# Patient Record
Sex: Male | Born: 1965 | Race: Black or African American | Hispanic: No | Marital: Married | Smoking: Current every day smoker
Health system: Southern US, Community
[De-identification: ages and names within clinical notes are randomized; demographics above are authoritative.]

## PROBLEM LIST (undated history)

## (undated) DIAGNOSIS — F17200 Nicotine dependence, unspecified, uncomplicated: Secondary | ICD-10-CM

## (undated) DIAGNOSIS — I10 Essential (primary) hypertension: Secondary | ICD-10-CM

---

## 2001-01-09 ENCOUNTER — Emergency Department (HOSPITAL_COMMUNITY): Admission: EM | Admit: 2001-01-09 | Discharge: 2001-01-09 | Payer: Self-pay | Admitting: *Deleted

## 2008-09-11 ENCOUNTER — Emergency Department (HOSPITAL_COMMUNITY): Admission: EM | Admit: 2008-09-11 | Discharge: 2008-09-12 | Payer: Self-pay | Admitting: Emergency Medicine

## 2011-07-16 LAB — POCT I-STAT, CHEM 8
BUN: 8 mg/dL (ref 6–23)
Calcium, Ion: 1.17 mmol/L (ref 1.12–1.32)
Chloride: 105 mEq/L (ref 96–112)
Creatinine, Ser: 0.9 mg/dL (ref 0.4–1.5)
Glucose, Bld: 90 mg/dL (ref 70–99)
HCT: 46 % (ref 39.0–52.0)
Hemoglobin: 15.6 g/dL (ref 13.0–17.0)
Potassium: 3.7 mEq/L (ref 3.5–5.1)
Sodium: 142 mEq/L (ref 135–145)
TCO2: 26 mmol/L (ref 0–100)

## 2016-11-05 ENCOUNTER — Emergency Department (HOSPITAL_COMMUNITY): Payer: Self-pay

## 2016-11-05 ENCOUNTER — Inpatient Hospital Stay (HOSPITAL_COMMUNITY)
Admission: EM | Admit: 2016-11-05 | Discharge: 2016-11-06 | DRG: 062 | Disposition: A | Discharge status: Home / Self Care | Payer: Self-pay | Attending: Neurology | Admitting: Neurology

## 2016-11-05 ENCOUNTER — Inpatient Hospital Stay (HOSPITAL_COMMUNITY): Payer: Self-pay

## 2016-11-05 ENCOUNTER — Encounter (HOSPITAL_COMMUNITY): Payer: Self-pay | Admitting: Emergency Medicine

## 2016-11-05 DIAGNOSIS — I6300 Cerebral infarction due to thrombosis of unspecified precerebral artery: Secondary | ICD-10-CM

## 2016-11-05 DIAGNOSIS — I161 Hypertensive emergency: Secondary | ICD-10-CM | POA: Diagnosis present

## 2016-11-05 DIAGNOSIS — I639 Cerebral infarction, unspecified: Principal | ICD-10-CM | POA: Diagnosis present

## 2016-11-05 DIAGNOSIS — T465X6A Underdosing of other antihypertensive drugs, initial encounter: Secondary | ICD-10-CM | POA: Diagnosis present

## 2016-11-05 DIAGNOSIS — E041 Nontoxic single thyroid nodule: Secondary | ICD-10-CM | POA: Diagnosis present

## 2016-11-05 DIAGNOSIS — Y92009 Unspecified place in unspecified non-institutional (private) residence as the place of occurrence of the external cause: Secondary | ICD-10-CM

## 2016-11-05 DIAGNOSIS — G8194 Hemiplegia, unspecified affecting left nondominant side: Secondary | ICD-10-CM

## 2016-11-05 DIAGNOSIS — I69354 Hemiplegia and hemiparesis following cerebral infarction affecting left non-dominant side: Secondary | ICD-10-CM

## 2016-11-05 DIAGNOSIS — F17200 Nicotine dependence, unspecified, uncomplicated: Secondary | ICD-10-CM | POA: Diagnosis present

## 2016-11-05 DIAGNOSIS — E785 Hyperlipidemia, unspecified: Secondary | ICD-10-CM | POA: Diagnosis present

## 2016-11-05 DIAGNOSIS — E876 Hypokalemia: Secondary | ICD-10-CM | POA: Diagnosis present

## 2016-11-05 DIAGNOSIS — I10 Essential (primary) hypertension: Secondary | ICD-10-CM | POA: Diagnosis present

## 2016-11-05 DIAGNOSIS — I6789 Other cerebrovascular disease: Secondary | ICD-10-CM

## 2016-11-05 DIAGNOSIS — Z91120 Patient's intentional underdosing of medication regimen due to financial hardship: Secondary | ICD-10-CM

## 2016-11-05 DIAGNOSIS — I160 Hypertensive urgency: Secondary | ICD-10-CM

## 2016-11-05 DIAGNOSIS — I6330 Cerebral infarction due to thrombosis of unspecified cerebral artery: Secondary | ICD-10-CM

## 2016-11-05 DIAGNOSIS — R29707 NIHSS score 7: Secondary | ICD-10-CM | POA: Diagnosis present

## 2016-11-05 HISTORY — DX: Nicotine dependence, unspecified, uncomplicated: F17.200

## 2016-11-05 HISTORY — DX: Essential (primary) hypertension: I10

## 2016-11-05 LAB — PROTIME-INR
INR: 0.97
PROTHROMBIN TIME: 12.9 s (ref 11.4–15.2)

## 2016-11-05 LAB — CBC
HCT: 45.2 % (ref 39.0–52.0)
Hemoglobin: 15.2 g/dL (ref 13.0–17.0)
MCH: 25.8 pg — AB (ref 26.0–34.0)
MCHC: 33.6 g/dL (ref 30.0–36.0)
MCV: 76.6 fL — ABNORMAL LOW (ref 78.0–100.0)
PLATELETS: 218 10*3/uL (ref 150–400)
RBC: 5.9 MIL/uL — AB (ref 4.22–5.81)
RDW: 16.2 % — ABNORMAL HIGH (ref 11.5–15.5)
WBC: 7.8 10*3/uL (ref 4.0–10.5)

## 2016-11-05 LAB — COMPREHENSIVE METABOLIC PANEL
ALBUMIN: 4.1 g/dL (ref 3.5–5.0)
ALK PHOS: 87 U/L (ref 38–126)
ALT: 15 U/L — ABNORMAL LOW (ref 17–63)
ANION GAP: 10 (ref 5–15)
AST: 19 U/L (ref 15–41)
BUN: 10 mg/dL (ref 6–20)
CALCIUM: 9.4 mg/dL (ref 8.9–10.3)
CHLORIDE: 107 mmol/L (ref 101–111)
CO2: 24 mmol/L (ref 22–32)
Creatinine, Ser: 0.85 mg/dL (ref 0.61–1.24)
GFR calc Af Amer: >60 mL/min (ref >60)
GFR calc non Af Amer: >60 mL/min (ref >60)
GLUCOSE: 91 mg/dL (ref 65–99)
Potassium: 3.3 mmol/L — ABNORMAL LOW (ref 3.5–5.1)
SODIUM: 141 mmol/L (ref 135–145)
Total Bilirubin: 0.4 mg/dL (ref 0.3–1.2)
Total Protein: 8.2 g/dL — ABNORMAL HIGH (ref 6.5–8.1)

## 2016-11-05 LAB — I-STAT CHEM 8, ED
BUN: 11 mg/dL (ref 6–20)
Calcium, Ion: 1.11 mmol/L — ABNORMAL LOW (ref 1.15–1.40)
Chloride: 105 mmol/L (ref 101–111)
Creatinine, Ser: 0.9 mg/dL (ref 0.61–1.24)
Glucose, Bld: 88 mg/dL (ref 65–99)
HEMATOCRIT: 50 % (ref 39.0–52.0)
HEMOGLOBIN: 17 g/dL (ref 13.0–17.0)
Potassium: 3.3 mmol/L — ABNORMAL LOW (ref 3.5–5.1)
SODIUM: 144 mmol/L (ref 135–145)
TCO2: 27 mmol/L (ref 0–100)

## 2016-11-05 LAB — RAPID URINE DRUG SCREEN, HOSP PERFORMED
Amphetamines: NOT DETECTED
BENZODIAZEPINES: NOT DETECTED
Barbiturates: NOT DETECTED
COCAINE: NOT DETECTED
OPIATES: NOT DETECTED
Tetrahydrocannabinol: NOT DETECTED

## 2016-11-05 LAB — DIFFERENTIAL
Basophils Absolute: 0 10*3/uL (ref 0.0–0.1)
Basophils Relative: 1 %
EOS PCT: 4 %
Eosinophils Absolute: 0.3 10*3/uL (ref 0.0–0.7)
LYMPHS PCT: 47 %
Lymphs Abs: 3.7 10*3/uL (ref 0.7–4.0)
Monocytes Absolute: 0.8 10*3/uL (ref 0.1–1.0)
Monocytes Relative: 10 %
NEUTROS ABS: 3 10*3/uL (ref 1.7–7.7)
NEUTROS PCT: 38 %

## 2016-11-05 LAB — ECHOCARDIOGRAM COMPLETE
HEIGHTINCHES: 74 in
WEIGHTICAEL: 2923.2 [oz_av]

## 2016-11-05 LAB — GLUCOSE, CAPILLARY: GLUCOSE-CAPILLARY: 120 mg/dL — AB (ref 65–99)

## 2016-11-05 LAB — CBG MONITORING, ED: GLUCOSE-CAPILLARY: 95 mg/dL (ref 65–99)

## 2016-11-05 LAB — LIPID PANEL
Cholesterol: 164 mg/dL (ref 0–200)
HDL: 23 mg/dL — AB (ref >40)
LDL Cholesterol: 128 mg/dL — ABNORMAL HIGH (ref 0–99)
Total CHOL/HDL Ratio: 7.1 RATIO
Triglycerides: 67 mg/dL (ref <150)
VLDL: 13 mg/dL (ref 0–40)

## 2016-11-05 LAB — I-STAT TROPONIN, ED: Troponin i, poc: 0 ng/mL (ref 0.00–0.08)

## 2016-11-05 LAB — MRSA PCR SCREENING: MRSA BY PCR: NEGATIVE

## 2016-11-05 LAB — APTT: aPTT: 35 seconds (ref 24–36)

## 2016-11-05 IMAGING — CT CT HEAD W/O CM
3 of 4 series · 15 of 47 positions shown, 18 images · non-contrast
Comparison: None.

CLINICAL DATA: Initial evaluation for acute left arm weakness.

EXAM:
CT HEAD WITHOUT CONTRAST
TECHNIQUE: Contiguous axial images were obtained from the base of the skull
through the vertex without intravenous contrast.

[Series 201: head w/o, idose (1) · axial · non-contrast · 0.49mm/px · z∈[+263,+403]mm · 9 of 34 slices shown, 12 images]
[im 3/34  brain]
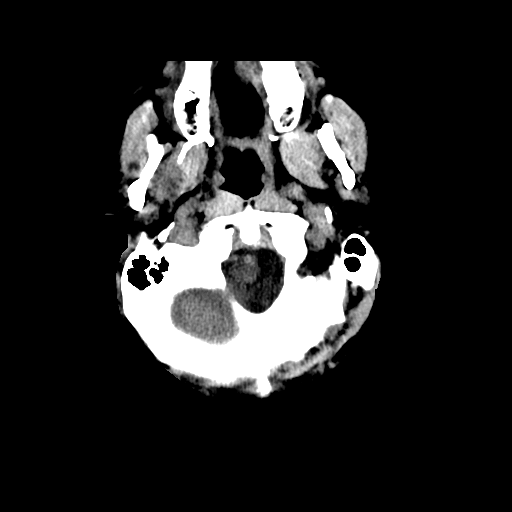
[im 3/34  bone]
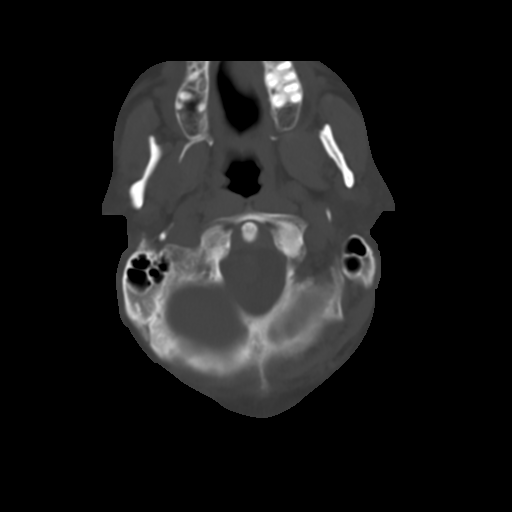
[im 8/34  brain]
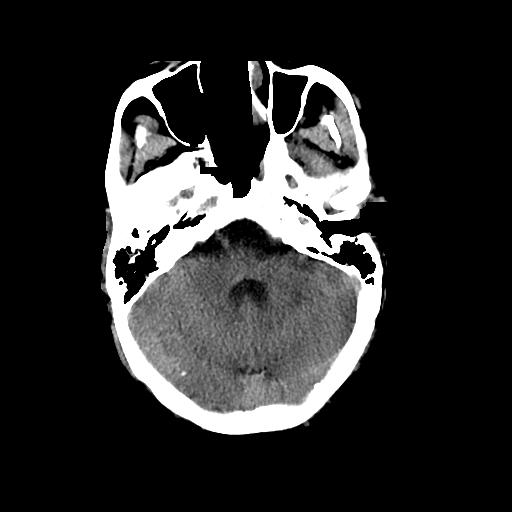
[im 10/34  brain]
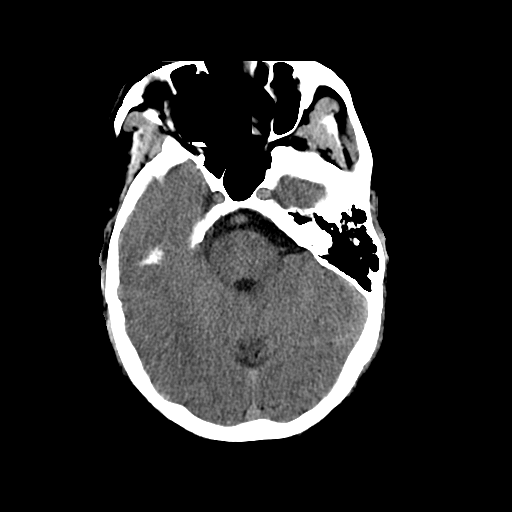
[im 15/34  brain]
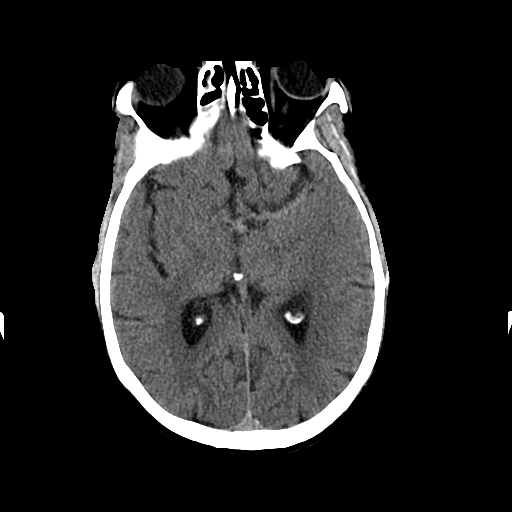
[im 17/34  brain]
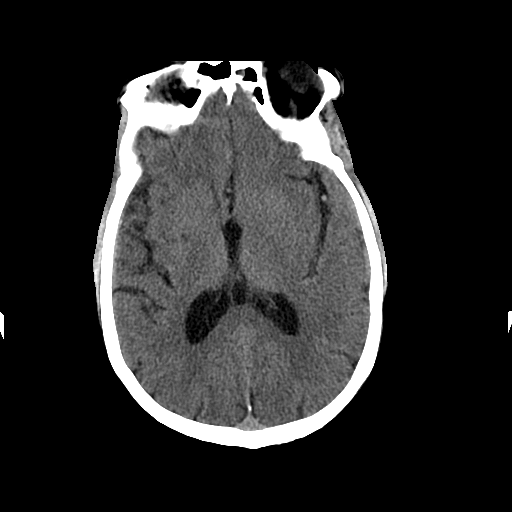
[im 17/34  bone]
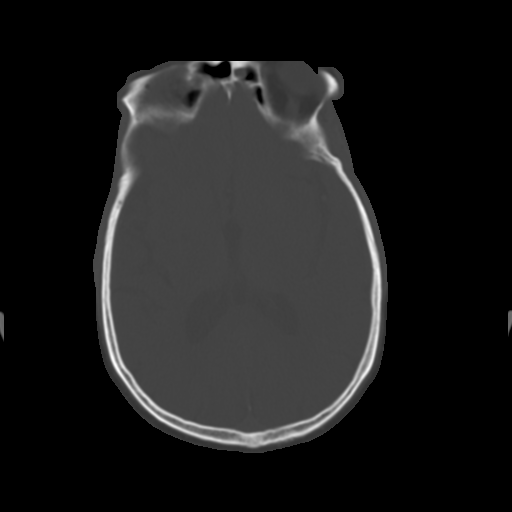
[im 19/34  brain]
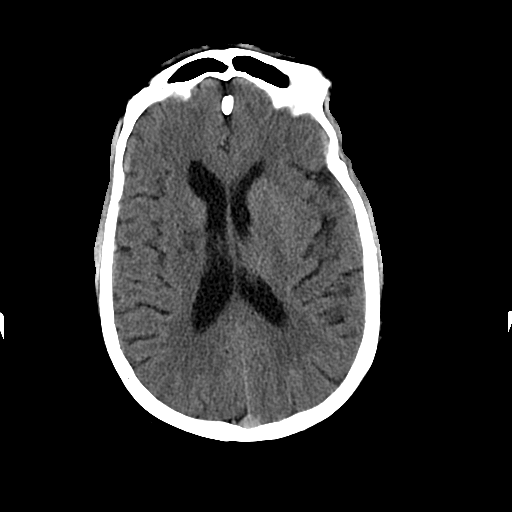
[im 24/34  brain]
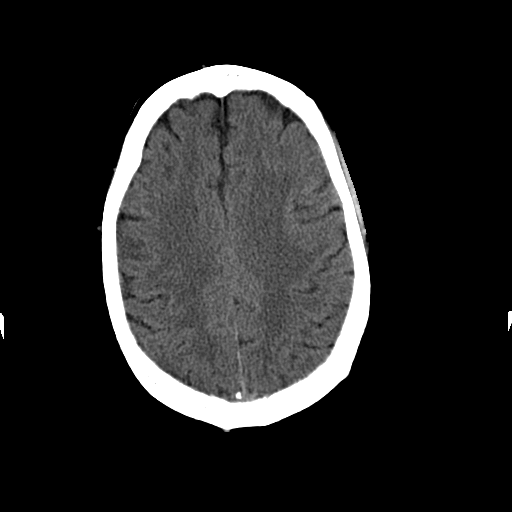
[im 26/34  brain]
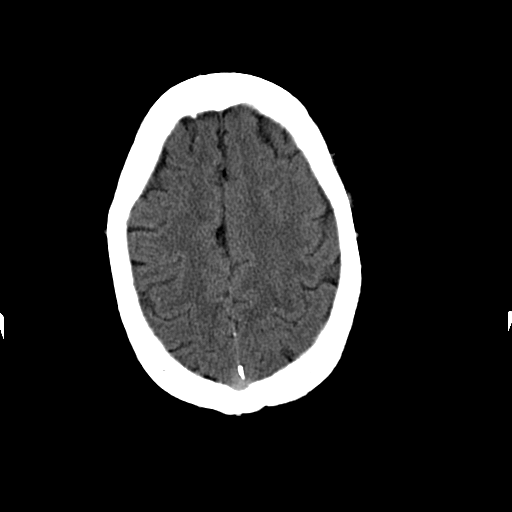
[im 31/34  brain]
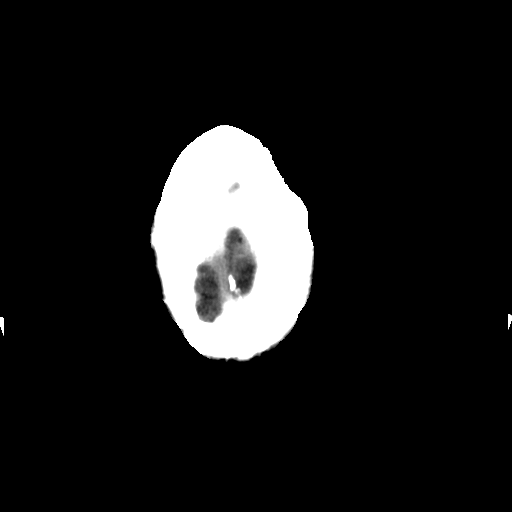
[im 31/34  bone]
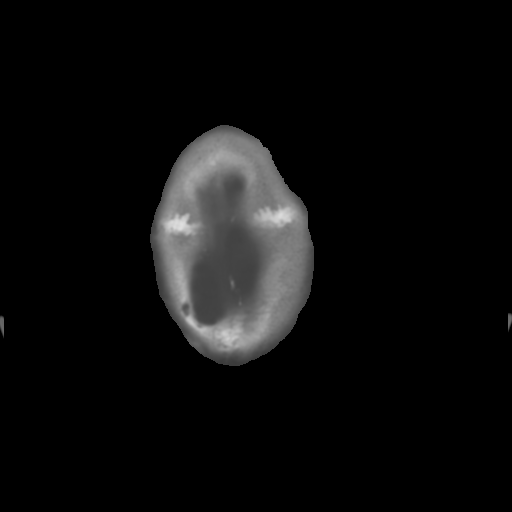

[Series 203: coronal st, idose (1) · coronal · 0.40mm/px · 3 of 83 slices shown]
[im 28/83  brain]
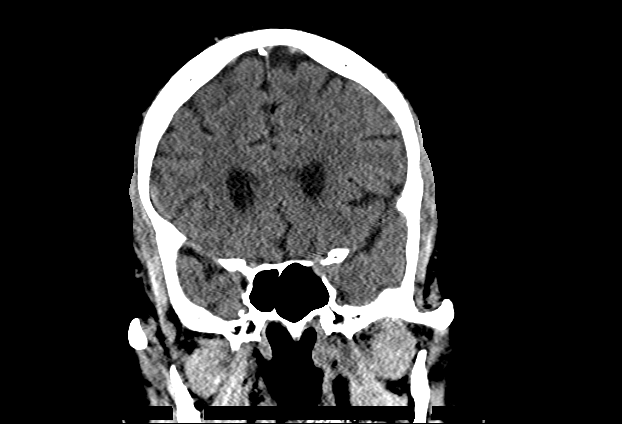
[im 37/83  brain]
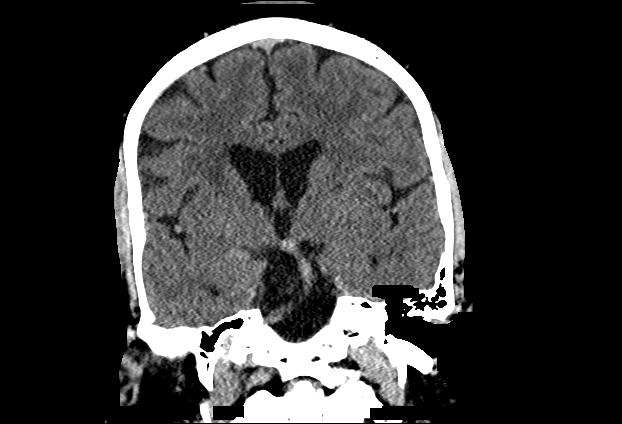
[im 46/83  brain]
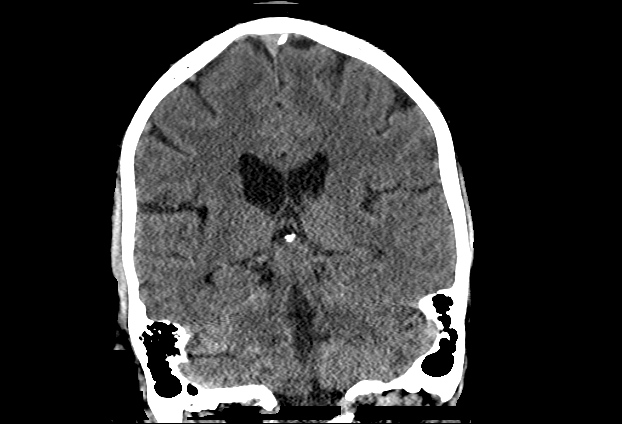

[Series 204: sagittal st, idose (1) · sagittal · 0.40mm/px · 3 of 83 slices shown]
[im 28/83  brain]
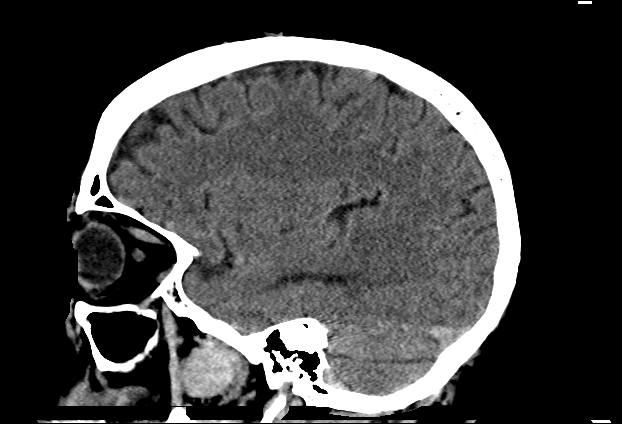
[im 42/83  brain]
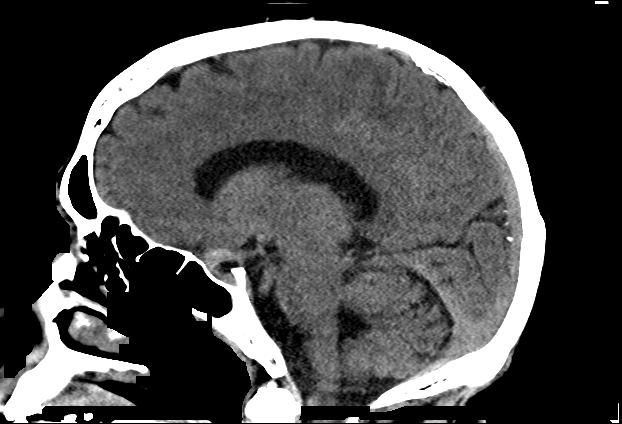
[im 55/83  brain]
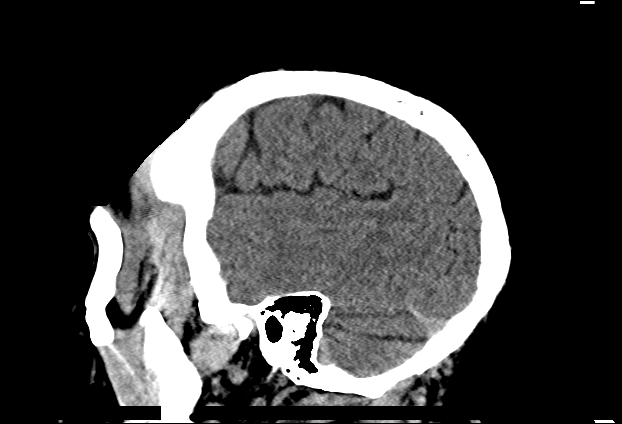

[15 of 47 positions shown; findings below may reference images not displayed]

FINDINGS: Brain: Age-related cerebral volume loss. Mild chronic microvascular
disease. Remote lacunar infarct noted within the right basal
ganglia. Small 11 mm asymmetric hypodensity within the white matter
of the left cerebral hemisphere noted, suspected to be
remote/chronic microvascular ischemic disease in nature.

No acute intracranial hemorrhage. No evidence for acute large vessel
territory infarct. No mass lesion, midline shift, or mass effect. No
hydrocephalus. No extra-axial fluid collection.

Vascular: Intracranial vasculature somewhat diffusely hyperdense,
with no asymmetric hyperdense vessel identified.

Skull: Scalp soft tissues within normal limits.  Calvarium intact.

Sinuses/Orbits: Globes and orbital soft tissues within normal
limits. Mild scattered mucosal thickening within the ethmoidal air
cells. Paranasal sinuses are otherwise clear. No mastoid effusion.

ASPECTS ([place] Stroke Program Early CT Score)

- Ganglionic level infarction (caudate, lentiform nuclei, internal
capsule, insula, M1-M3 cortex): 7

- Supraganglionic infarction (M4-M6 cortex): 3

Total score (0-10 with 10 being normal): 10
IMPRESSION: 1. No acute intracranial infarct or other process identified.
2. ASPECTS is 10
3. Remote lacunar infarct involving the right basal ganglia.
4. Approximate 1 cm asymmetric hypodensity within the white matter
of the left cerebellar hemisphere, nonspecific, but suspected to be
chronic in nature, possibly due to prior ischemia.
Critical Value/emergent results were called by telephone at the time
of interpretation on [DATE] at [DATE] to Dr. CHUC, who
verbally acknowledged these results.

## 2016-11-05 MED ORDER — PANTOPRAZOLE SODIUM 40 MG PO TBEC
40.0000 mg | DELAYED_RELEASE_TABLET | Freq: Every day | ORAL | Status: DC
  Administered 2016-11-06: 40 mg via ORAL
  Filled 2016-11-05: qty 1

## 2016-11-05 MED ORDER — STROKE: EARLY STAGES OF RECOVERY BOOK
Freq: Once | Status: AC
  Administered 2016-11-05: 1
  Filled 2016-11-05: qty 1

## 2016-11-05 MED ORDER — PANTOPRAZOLE SODIUM 40 MG IV SOLR
40.0000 mg | Freq: Every day | INTRAVENOUS | Status: DC
  Administered 2016-11-05: 40 mg via INTRAVENOUS
  Filled 2016-11-05 (×2): qty 40

## 2016-11-05 MED ORDER — ACETAMINOPHEN 650 MG RE SUPP: 650.0000 mg | RECTAL | Status: DC | PRN

## 2016-11-05 MED ORDER — ACETAMINOPHEN 160 MG/5ML PO SOLN: 650.0000 mg | ORAL | Status: DC | PRN

## 2016-11-05 MED ORDER — POTASSIUM CHLORIDE CRYS ER 20 MEQ PO TBCR
20.0000 meq | EXTENDED_RELEASE_TABLET | Freq: Three times a day (TID) | ORAL | Status: DC
  Administered 2016-11-05 – 2016-11-06 (×2): 20 meq via ORAL
  Filled 2016-11-05 (×3): qty 1

## 2016-11-05 MED ORDER — PANTOPRAZOLE SODIUM 40 MG IV SOLR: 40.0000 mg | Freq: Every day | INTRAVENOUS | Status: DC

## 2016-11-05 MED ORDER — ATORVASTATIN CALCIUM 40 MG PO TABS
40.0000 mg | ORAL_TABLET | Freq: Every day | ORAL | Status: DC
  Administered 2016-11-05: 40 mg via ORAL
  Filled 2016-11-05 (×2): qty 1

## 2016-11-05 MED ORDER — ACETAMINOPHEN 325 MG PO TABS: 650.0000 mg | ORAL_TABLET | ORAL | Status: DC | PRN

## 2016-11-05 MED ORDER — NICARDIPINE HCL IN NACL 20-0.86 MG/200ML-% IV SOLN
3.0000 mg/h | INTRAVENOUS | Status: DC
  Administered 2016-11-05: 12.5 mg/h via INTRAVENOUS
  Administered 2016-11-05: 5 mg/h via INTRAVENOUS
  Filled 2016-11-05 (×2): qty 200
  Filled 2016-11-05: qty 400

## 2016-11-05 MED ORDER — SODIUM CHLORIDE 0.9 % IV SOLN
INTRAVENOUS | Status: DC
  Administered 2016-11-05: 03:00:00 via INTRAVENOUS

## 2016-11-05 MED ORDER — POTASSIUM CHLORIDE 20 MEQ PO PACK
20.0000 meq | PACK | Freq: Three times a day (TID) | ORAL | Status: DC
  Administered 2016-11-05: 20 meq via ORAL
  Filled 2016-11-05 (×2): qty 1

## 2016-11-05 MED ORDER — ALTEPLASE (STROKE) FULL DOSE INFUSION
0.9000 mg/kg | Freq: Once | INTRAVENOUS | Status: AC
  Administered 2016-11-05: 75 mg via INTRAVENOUS
  Filled 2016-11-05: qty 100

## 2016-11-05 MED ORDER — IRBESARTAN 150 MG PO TABS
150.0000 mg | ORAL_TABLET | Freq: Every day | ORAL | Status: DC
  Administered 2016-11-05 – 2016-11-06 (×2): 150 mg via ORAL
  Filled 2016-11-05 (×2): qty 1

## 2016-11-05 NOTE — ED Notes (Signed)
Patient cleared by EDP Criss AlvineGoldston for CT

## 2016-11-05 NOTE — ED Notes (Signed)
Attempting to complete stroke screen when speech began to change, stopped screen.

## 2016-11-05 NOTE — Consult Note (Signed)
Admission H&P    Chief Complaint: Acute onset of slurred speech, facial droop and left hemiparesthesias.  HPI: Todd Hunter is an 51 y.o. male with a history of hypertension and less than ideal compliance with treatment, presenting with acute onset of left-sided weakness with facial droop and slurred speech. Symptom onset was at 11:55 PM tonight. He has no previous history of stroke nor TIA. He has not taken antihypertensive medications since 11/01/2016. Blood pressure in the ED was 210/130. He was started on Cardene drip. CT scan of his head showed no acute intracranial abnormality. Patient was deemed a candidate for TPA which was administered. There was delay in administration due to efforts to get his blood pressure down to acceptable range for TPA administration with Cardene IV. NIH stroke score was 7.  LSN: 11:55 PM on 11/04/2016 tPA Given: Yes mRankin:  Past Medical History:  Diagnosis Date  . Hypertension     History reviewed. No pertinent surgical history.  No family history on file. Social History:  has no tobacco, alcohol, and drug history on file.  Allergies: No Known Allergies  Medications: Preadmission medications were reviewed by me.  ROS: History obtained from the patient  General ROS: negative for - chills, fatigue, fever, night sweats, weight gain or weight loss Psychological ROS: negative for - behavioral disorder, hallucinations, memory difficulties, mood swings or suicidal ideation Ophthalmic ROS: negative for - blurry vision, double vision, eye pain or loss of vision ENT ROS: negative for - epistaxis, nasal discharge, oral lesions, sore throat, tinnitus or vertigo Allergy and Immunology ROS: negative for - hives or itchy/watery eyes Hematological and Lymphatic ROS: negative for - bleeding problems, bruising or swollen lymph nodes Endocrine ROS: negative for - galactorrhea, hair pattern changes, polydipsia/polyuria or temperature intolerance Respiratory ROS:  negative for - cough, hemoptysis, shortness of breath or wheezing Cardiovascular ROS: negative for - chest pain, dyspnea on exertion, edema or irregular heartbeat Gastrointestinal ROS: negative for - abdominal pain, diarrhea, hematemesis, nausea/vomiting or stool incontinence Genito-Urinary ROS: negative for - dysuria, hematuria, incontinence or urinary frequency/urgency Musculoskeletal ROS: negative for - joint swelling or muscular weakness Neurological ROS: as noted in HPI Dermatological ROS: negative for rash and skin lesion changes  Physical Examination: Blood pressure 157/100, pulse (!) 121, temperature 98.6 F (37 C), resp. rate 15, height '6\' 2"'$  (1.88 m), weight 82.9 kg (182 lb 11.2 oz), SpO2 99 %.  HEENT-  Normocephalic, no lesions, without obvious abnormality.  Normal external eye and conjunctiva.  Normal TM's bilaterally.  Normal auditory canals and external ears. Normal external nose, mucus membranes and septum.  Normal pharynx. Neck supple with no masses, nodes, nodules or enlargement. Cardiovascular - regular rate and rhythm, S1, S2 normal, no murmur, click, rub or gallop Lungs - chest clear, no wheezing, rales, normal symmetric air entry Abdomen - soft, non-tender; bowel sounds normal; no masses,  no organomegaly Extremities - no joint deformities, effusion, or inflammation and no edema  Neurologic Examination: Mental Status: Alert, oriented, thought content appropriate.  Speech moderately slurred without evidence of aphasia. Able to follow commands without difficulty. Cranial Nerves: II-Visual fields were normal. III/IV/VI-Pupils were equal and reacted normally to light. Extraocular movements were full and conjugate.    V/VII-no facial numbness; moderate left lower facial weakness. VIII-normal. X-moderate dysarthria. XI: trapezius strength/neck flexion strength normal bilaterally XII-midline tongue extension with normal strength. Motor: Severe weakness of left upper  extremity proximally and distally; mild proximal weakness of left lower extremity; motor exam otherwise unremarkable. Sensory:  Normal throughout. Deep Tendon Reflexes: 2+ and symmetric. Plantars: Mute bilaterally Cerebellar: Normal finger-to-nose testing with use of right upper extremity. Carotid auscultation: Normal  Results for orders placed or performed during the hospital encounter of 11/05/16 (from the past 48 hour(s))  Protime-INR     Status: None   Collection Time: 11/05/16 12:32 AM  Result Value Ref Range   Prothrombin Time 12.9 11.4 - 15.2 seconds   INR 0.97   APTT     Status: None   Collection Time: 11/05/16 12:32 AM  Result Value Ref Range   aPTT 35 24 - 36 seconds  CBC     Status: Abnormal   Collection Time: 11/05/16 12:32 AM  Result Value Ref Range   WBC 7.8 4.0 - 10.5 K/uL   RBC 5.90 (H) 4.22 - 5.81 MIL/uL   Hemoglobin 15.2 13.0 - 17.0 g/dL   HCT 45.2 39.0 - 52.0 %   MCV 76.6 (L) 78.0 - 100.0 fL   MCH 25.8 (L) 26.0 - 34.0 pg   MCHC 33.6 30.0 - 36.0 g/dL   RDW 16.2 (H) 11.5 - 15.5 %   Platelets 218 150 - 400 K/uL  Differential     Status: None   Collection Time: 11/05/16 12:32 AM  Result Value Ref Range   Neutrophils Relative % 38 %   Neutro Abs 3.0 1.7 - 7.7 K/uL   Lymphocytes Relative 47 %   Lymphs Abs 3.7 0.7 - 4.0 K/uL   Monocytes Relative 10 %   Monocytes Absolute 0.8 0.1 - 1.0 K/uL   Eosinophils Relative 4 %   Eosinophils Absolute 0.3 0.0 - 0.7 K/uL   Basophils Relative 1 %   Basophils Absolute 0.0 0.0 - 0.1 K/uL  Comprehensive metabolic panel     Status: Abnormal   Collection Time: 11/05/16 12:32 AM  Result Value Ref Range   Sodium 141 135 - 145 mmol/L   Potassium 3.3 (L) 3.5 - 5.1 mmol/L   Chloride 107 101 - 111 mmol/L   CO2 24 22 - 32 mmol/L   Glucose, Bld 91 65 - 99 mg/dL   BUN 10 6 - 20 mg/dL   Creatinine, Ser 0.85 0.61 - 1.24 mg/dL   Calcium 9.4 8.9 - 10.3 mg/dL   Total Protein 8.2 (H) 6.5 - 8.1 g/dL   Albumin 4.1 3.5 - 5.0 g/dL   AST 19  15 - 41 U/L   ALT 15 (L) 17 - 63 U/L   Alkaline Phosphatase 87 38 - 126 U/L   Total Bilirubin 0.4 0.3 - 1.2 mg/dL   GFR calc non Af Amer >60 >60 mL/min   GFR calc Af Amer >60 >60 mL/min    Comment: (NOTE) The eGFR has been calculated using the CKD EPI equation. This calculation has not been validated in all clinical situations. eGFR's persistently <60 mL/min signify possible Chronic Kidney Disease.    Anion gap 10 5 - 15  CBG monitoring, ED     Status: None   Collection Time: 11/05/16 12:33 AM  Result Value Ref Range   Glucose-Capillary 95 65 - 99 mg/dL   Comment 1 Notify RN    Comment 2 Document in Chart   I-stat troponin, ED     Status: None   Collection Time: 11/05/16 12:37 AM  Result Value Ref Range   Troponin i, poc 0.00 0.00 - 0.08 ng/mL   Comment 3            Comment: Due to the release kinetics of  cTnI, a negative result within the first hours of the onset of symptoms does not rule out myocardial infarction with certainty. If myocardial infarction is still suspected, repeat the test at appropriate intervals.   I-Stat Chem 8, ED     Status: Abnormal   Collection Time: 11/05/16 12:39 AM  Result Value Ref Range   Sodium 144 135 - 145 mmol/L   Potassium 3.3 (L) 3.5 - 5.1 mmol/L   Chloride 105 101 - 111 mmol/L   BUN 11 6 - 20 mg/dL   Creatinine, Ser 0.90 0.61 - 1.24 mg/dL   Glucose, Bld 88 65 - 99 mg/dL   Calcium, Ion 1.11 (L) 1.15 - 1.40 mmol/L   TCO2 27 0 - 100 mmol/L   Hemoglobin 17.0 13.0 - 17.0 g/dL   HCT 50.0 39.0 - 52.0 %   Ct Head Wo Contrast  Result Date: 11/05/2016 CLINICAL DATA:  Initial evaluation for acute left arm weakness. EXAM: CT HEAD WITHOUT CONTRAST TECHNIQUE: Contiguous axial images were obtained from the base of the skull through the vertex without intravenous contrast. COMPARISON:  None. FINDINGS: Brain: Age-related cerebral volume loss. Mild chronic microvascular disease. Remote lacunar infarct noted within the right basal ganglia. Small 11 mm  asymmetric hypodensity within the white matter of the left cerebral hemisphere noted, suspected to be remote/chronic microvascular ischemic disease in nature. No acute intracranial hemorrhage. No evidence for acute large vessel territory infarct. No mass lesion, midline shift, or mass effect. No hydrocephalus. No extra-axial fluid collection. Vascular: Intracranial vasculature somewhat diffusely hyperdense, with no asymmetric hyperdense vessel identified. Skull: Scalp soft tissues within normal limits.  Calvarium intact. Sinuses/Orbits: Globes and orbital soft tissues within normal limits. Mild scattered mucosal thickening within the ethmoidal air cells. Paranasal sinuses are otherwise clear. No mastoid effusion. ASPECTS [facility] Stroke Program Early CT Score) - Ganglionic level infarction (caudate, lentiform nuclei, internal capsule, insula, M1-M3 cortex): 7 - Supraganglionic infarction (M4-M6 cortex): 3 Total score (0-10 with 10 being normal): 10 IMPRESSION: 1. No acute intracranial infarct or other process identified. 2. ASPECTS is 10 3. Remote lacunar infarct involving the right basal ganglia. 4. Approximate 1 cm asymmetric hypodensity within the white matter of the left cerebellar hemisphere, nonspecific, but suspected to be chronic in nature, possibly due to prior ischemia. Critical Value/emergent results were called by telephone at the time of interpretation on 11/05/2016 at 1:04 am to Dr. Nicole Kindred, who verbally acknowledged these results. Electronically Signed   By: Jeannine Boga M.D.   On: 11/05/2016 01:07    Assessment: 51 y.o. male history of hypertension presenting with acute right ischemic cerebral infarction, probably subcortical, as well as hypertensive emergency.  Stroke Risk Factors - hypertension  Plan: 1. Post TPA management and intensive care unit overnight 2. MRI, MRA  of the brain without contrast 3. PT consult, OT consult, Speech consult 4. Echocardiogram 5. Carotid  dopplers 6. Prophylactic therapy-Antiplatelet med: Aspirin  7. Risk factor modification 8. HgbA1c, fasting lipid panel  This patient is critically ill and at significant risk of neurological worsening or death, and care requires constant monitoring of vital signs, hemodynamics,respiratory and cardiac monitoring, neurological assessment, discussion with family, other specialists and medical decision making of high complexity. Total critical care time was 90 minutes.  C.R. Nicole Kindred, MD [facility] [phone]  11/05/2016, 1:54 AM

## 2016-11-05 NOTE — Care Management Note (Addendum)
Case Management Note  Patient Details  Name: Fletcher AnonCordice D Gassman MRN: [id number] Date of Birth: 03/17/1966  Subjective/Objective:  Pt admitted with stroke                  Action/Plan:  PTA independent from home with partner.  Pt states he has a PCP Dr. Ralene Okoy [place] in [place].  Pt stated she would rather stay with this PCP than transition to one of [facility] affiliated clinics because he has a good doctor/patient relationship  and this doctor consistently gives him samples for medications, additonally will provide alternative options when needed.  Pt confessed that his PCP recently placed him on an additional HTN med post office visit and informed pt that if he couldn't afford it - to follow up back with MD.  Unfortunately, pt stated he did not follow back up with the doctor to inform that the latest medication was not affordable and therefore did not initiate treatment as prescribed.  CM provided extensive teaching regarding the importance of communication with MD, and how not taking the additional med as prescribed potentially led to stroke.  Pt stated he understood and informed CM that would contacted MD today and inform of admit and inability to afford latest prescribed med.  Pt is already covered for Edarbi via ongoing samples provided by PCP.  CM will provide pt [facility] information as second option for the future if needed.  CM will continue to follow for discharge needs - pt will likely need MATCH letter at discharge   Expected Discharge Date:                  Expected Discharge Plan:  Home/Self Care  In-House Referral:     Discharge planning Services  CM Consult, MATCH Program  Post Acute Care Choice:    Choice offered to:     DME Arranged:    DME Agency:     HH Arranged:    HH Agency:     Status of Service:  In process, will continue to follow  If discussed at Long Length of Stay Meetings, dates discussed:    Additional Comments:  Cherylann ParrClaxton,  Seif Teichert S, RN 11/05/2016, 10:30 AM

## 2016-11-05 NOTE — Progress Notes (Signed)
Mr. Todd Hunter is a 51 yo male with a hx of HTN that is non compliant with medication x3 days. Pt states he was lying in the bed watching tv holding his phone when he noticed he couldn't lift his left arm. Pt's roommate noted left facial droop and slurred speech, 911 called immediately. LNW 2355, CBG 95, NIHSS 7. TPA started at 0110. Neurology to admit

## 2016-11-05 NOTE — Evaluation (Signed)
Speech Language Pathology Evaluation Patient Details Name: Todd Hunter D Getman MRN: [id number] DOB: 02/27/1966 Today's Date: 11/05/2016 Time: [id number] SLP Time Calculation (min) (ACUTE ONLY): 18 min  Problem List:  Patient Active Problem List   Diagnosis Date Noted  . Stroke (HCC) 11/05/2016  . CVA (cerebral vascular accident) (HCC) 11/05/2016   Past Medical History:  Past Medical History:  Diagnosis Date  . Hypertension    Past Surgical History: History reviewed. No pertinent surgical history. HPI:  Lonnie D Goinsis an 51 y.o.malewith a history of hypertension and less than ideal compliance with treatment,presenting with acute onset of left-sided weakness with facial droop and slurred speech. Symptom onset was at 11:55 PM tonight. He has no previous history of stroke nor TIA. Hehas not taken antihypertensive medications since 11/01/2016.Blood pressure in the ED was 210/130.He was started on Cardene drip.CT scan of his head showed no acute intracranial abnormality. Patient was deemed a candidate for TPA which was administered. There was delay in administration due to efforts to get his blood pressure down to acceptable range for TPA administration with Cardene IV. NIH stroke score was 7.   Assessment / Plan / Recommendation Clinical Impression  Cognitive/linguistic and motor speech screening were completed.  The patient's speech was clear and easy to understand. Oral mechanism exam was unremarkable except for a very slight left side labial droop only seen given movement.  The patient achieved a score of 29/30 on the MOCA-B indicating functional skills.  The patient was able to easily perform the executive function task.  He also had good immediate and delayed recall, was oriented x4, had good word fluency skills, and performed simple calculations.  He easily performed a visuoperceptual task and had good attention to task.  Basic problem solving skills were intact.  At this time, ST needs  are not identified.      SLP Assessment  Patient does not need any further Speech Lanaguage Pathology Services             SLP Evaluation Cognition  Overall Cognitive Status: Within Functional Limits for tasks assessed Arousal/Alertness: Awake/alert Orientation Level: Oriented X4 Attention: Focused Focused Attention: Appears intact Memory: Appears intact Awareness: Appears intact Problem Solving: Appears intact Executive Function: Sequencing Sequencing: Environmental consultantAppears intact       Comprehension  Auditory Comprehension Overall Auditory Comprehension: Appears within functional limits for tasks assessed Commands: Within Functional Limits Conversation: Complex Reading Comprehension Reading Status: Within funtional limits    Expression Expression Primary Mode of Expression: Verbal Verbal Expression Overall Verbal Expression: Appears within functional limits for tasks assessed Initiation: No impairment Automatic Speech: Name;Social Response Level of Generative/Spontaneous Verbalization: Conversation Naming: No impairment Pragmatics: No impairment Non-Verbal Means of Communication: Not applicable Written Expression Dominant Hand: Right Written Expression: Within Functional Limits   Oral / Motor  Oral Motor/Sensory Function Overall Oral Motor/Sensory Function: Within functional limits Motor Speech Overall Motor Speech: Appears within functional limits for tasks assessed Respiration: Within functional limits Phonation: Normal Resonance: Within functional limits Articulation: Within functional limitis Intelligibility: Intelligible Motor Planning: Witnin functional limits Motor Speech Errors: Not applicable   GO                    Dimas AguasMelissa Estanislao Harmon, MA, CCC-SLP Acute Rehab SLP [id number] Contras[address], Tashianna Broome N 11/05/2016, 1:50 PM

## 2016-11-05 NOTE — Progress Notes (Signed)
PT Cancellation Note  Patient Details Name: Todd Hunter MRN: [name] DOB: 01/05/1966   Cancelled Treatment:    Reason Eval/Treat Not Completed: Medical issues which prohibited therapy (pt s/p tPA and on strict bedrest. Will attempt next date as medically appropriate)   [name] B [name] 11/05/2016, 7:11 AM  Delaney MeigsMaija Tabor Osmar Howton, PT [phone]

## 2016-11-05 NOTE — Progress Notes (Signed)
STROKE TEAM PROGRESS NOTE   HISTORY OF PRESENT ILLNESS (per record) Todd Hunter is an 51 y.o. male with a history of hypertension and less than ideal compliance with treatment, presenting with acute onset of left-sided weakness with facial droop and slurred speech. Symptom onset was at 11:55 PM tonight. He has no previous history of stroke nor TIA. He has not taken antihypertensive medications since 11/01/2016. Blood pressure in the ED was 210/130. He was started on Cardene drip. CT scan of his head showed no acute intracranial abnormality. Patient was deemed a candidate for TPA which was administered. There was delay in administration due to efforts to get his blood pressure down to acceptable range for TPA administration with Cardene IV. NIH stroke score was 7.  LSN: 11:55 PM on 11/04/2016 tPA Given: Yes mRankin:   SUBJECTIVE (INTERVAL HISTORY) Friend at the bedside. Patient requested that he stay during visit. Dr Pearlean Brownie discussed the patients stroke, t-PA therapy, and modification of stroke risk factors. The patient expressed understanding.   OBJECTIVE Temp:  [98.5 F (36.9 C)-98.6 F (37 C)] 98.6 F (37 C) (01/26 1156) Pulse Rate:  [71-122] 71 (01/26 0800) Cardiac Rhythm: Normal sinus rhythm (01/26 0730) Resp:  [12-28] 16 (01/26 0800) BP: (119-234)/(78-140) 132/87 (01/26 0800) SpO2:  [98 %-100 %] 99 % (01/26 0800) Weight:  [82.9 kg (182 lb 11.2 oz)] 82.9 kg (182 lb 11.2 oz) (01/26 0047)  CBC:   Recent Labs Lab 11/05/16 0032 11/05/16 0039  WBC 7.8  --   NEUTROABS 3.0  --   HGB 15.2 17.0  HCT 45.2 50.0  MCV 76.6*  --   PLT 218  --     Basic Metabolic Panel:   Recent Labs Lab 11/05/16 0032 11/05/16 0039  NA 141 144  K 3.3* 3.3*  CL 107 105  CO2 24  --   GLUCOSE 91 88  BUN 10 11  CREATININE 0.85 0.90  CALCIUM 9.4  --     Lipid Panel:     Component Value Date/Time   CHOL 164 11/05/2016 0712   TRIG 67 11/05/2016 0712   HDL 23 (L) 11/05/2016 0712    CHOLHDL 7.1 11/05/2016 0712   VLDL 13 11/05/2016 0712   LDLCALC 128 (H) 11/05/2016 0712   HgbA1c: No results found for: HGBA1C Urine Drug Screen:     Component Value Date/Time   LABOPIA NONE DETECTED 11/05/2016 0140   COCAINSCRNUR NONE DETECTED 11/05/2016 0140   LABBENZ NONE DETECTED 11/05/2016 0140   AMPHETMU NONE DETECTED 11/05/2016 0140   THCU NONE DETECTED 11/05/2016 0140   LABBARB NONE DETECTED 11/05/2016 0140      IMAGING  Ct Head Wo Contrast 11/05/2016 1. No acute intracranial infarct or other process identified.  2. ASPECTS is 10  3. Remote lacunar infarct involving the right basal ganglia.  4. Approximate 1 cm asymmetric hypodensity within the white matter of the left cerebellar hemisphere, nonspecific, but suspected to be chronic in nature, possibly due to prior ischemia.     Ct Head Wo Contrast - Repeat post t-PA - pending 11/05/2016    MRI Brain W/O Contrast - pending 11/05/2016    PHYSICAL EXAM  Pleasant middle aged african [place] male not in distress. . Afebrile. Head is nontraumatic. Neck is supple without bruit.    Cardiac exam no murmur or gallop. Lungs are clear to auscultation. Distal pulses are well felt.  Neurological Exam :  Awake alert oriented x 3 normal speech and language. Mild left lower face asymmetry.  Tongue midline. No drift. Mild diminished fine finger movements on left. Orbits right over left upper extremity. Mild left grip weak.. Normal sensation . Normal coordination.        ASSESSMENT/PLAN  Mr. Todd Hunter is a 51 y.o. male with history of hypertension and medical noncompliance presenting with uncontrolled hypertension, left-sided weakness, slurred speech, and facial droop.   He received IV t-PA Friday 11/05/2016 at 0115.  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  Resultant  Mild left hemiparesis  MRI - pending  MRA - not performed  Carotid Doppler - pending  2D Echo -  pending  LDL - 128  HgbA1c - pending  VTE prophylaxis - SCDs Diet NPO time specified  No antithrombotic prior to admission, now on No antithrombotic (nurse to call for ASA order after f/u imaging)  Patient counseled to be compliant with his antithrombotic medications  Ongoing aggressive stroke risk factor management  Therapy recommendations: pending  Disposition: Pending  Hypertension  Stable  Permissive hypertension (OK if < 220/120) but gradually normalize in 5-7 days  Long-term BP goal normotensive  Hyperlipidemia  Home meds:  No lipid lowering medications prior to admission  LDL 128, goal < 70  Start Lipitor 40 mg daily  Continue statin at discharge    Other Stroke Risk Factors  ETOH use, advised to drink no more than 1 drink per day  Hx stroke/TIA - remote lacunar infarct involving the right basal ganglia by CT.  Medical noncompliance - importance of blood pressure control reinforced.    Other Active Problems  Hypokalemia - supplement - recheck in AM  NPO - Patient passed swallow screen in ED. Will order diet.  Hypertension - home medication Edarbi (new AARB) - pharmacy recommends Avapro as a substitute.  Bedrest post t-PA  Hospital day # 0  Delton SeeDavid Rinehuls PA-C [facility] Pager [phone] 11/05/2016, 12:37 PM I have personally examined this patient, reviewed notes, independently viewed imaging studies, participated in medical decision making and plan of care.ROS completed by me personally and pertinent positives fully documented  I have made any additions or clarifications directly to the above note. Agree with note above. He presented with left hemiparesis secondary to right brain subcortical infarct and received IV tPA and has made significant improvement. He remains at risk for neurological worsening and needs close neurological monitoring and strict blood pressure control. Continue ongoing stroke evaluation. This patient is  critically ill and at significant risk of neurological worsening, death and care requires constant monitoring of vital signs, hemodynamics,respiratory and cardiac monitoring, extensive review of multiple databases, frequent neurological assessment, discussion with family, other specialists and medical decision making of high complexity.I have made any additions or clarifications directly to the above note.This critical care time does not reflect procedure time, or teaching time or supervisory time of PA/NP/Med Resident etc but could involve care discussion time.  I spent 30 minutes of neurocritical care time  in the care of  this patient.      Delia HeadyPramod Sethi, MD Medical Director [facility] Pager: [phone] 11/05/2016 1:31 PM   To contact Stroke Continuity provider, please refer to [email or web address]. After hours, contact General Neurology

## 2016-11-05 NOTE — ED Provider Notes (Signed)
[facility]-EMERGENCY DEPT Provider Note   CSN: [id number] Arrival date & time: 11/05/16  0030   By signing my name below, I, Clovis Pu, attest that this documentation has been prepared under the direction and in the presence of Pricilla Loveless, MD  Electronically Signed: Clovis Pu, ED Scribe. 11/05/16. 12:49 AM.   History   Chief Complaint Chief Complaint  Patient presents with  . Code Stroke   The history is provided by the EMS personnel. No language interpreter was used.   HPI Comments:  Todd Hunter is a 51 y.o. male who presents to the Emergency Department complaining of acute onset, stroke like symptoms x today. Per EMS, pt was last normal at 23:55 yesterday. EMS personnel reports slurred speech, left sided facial droop and left sided weakness. No alleviating factors noted. Pt denies a headache. No other associated symptoms noted. No family hx of stroke. He last took his BP medication in the AM 4 days ago. EMS personnel reports the pt is not compliant with his medication.   Past Medical History:  Diagnosis Date  . Hypertension     Patient Active Problem List   Diagnosis Date Noted  . Stroke [facility]) 11/05/2016    History reviewed. No pertinent surgical history.     Home Medications    Prior to Admission medications   Medication Sig Start Date End Date Taking? Authorizing Provider  Azilsartan Medoxomil (EDARBI) 80 MG TABS Take 40 mg by mouth daily.   Yes Historical Provider, MD    Family History No family history on file.  Social History Social History  Substance Use Topics  . Smoking status: Not on file  . Smokeless tobacco: Not on file  . Alcohol use Not on file     Allergies   Patient has no known allergies.   Review of Systems Review of Systems  Constitutional: Negative for fever.  Neurological: Positive for facial asymmetry, speech difficulty and weakness. Negative for headaches.  All other systems reviewed and are negative.    Physical  Exam Updated Vital Signs BP 157/100   Pulse (!) 121   Temp 98.6 F (37 C)   Resp 15   Ht 6\' 2"  (1.88 m)   Wt 182 lb 11.2 oz (82.9 kg)   SpO2 99%   BMI 23.46 kg/m   Physical Exam  Constitutional: He is oriented to person, place, and time. He appears well-developed and well-nourished.  HENT:  Head: Normocephalic and atraumatic.  Right Ear: External ear normal.  Left Ear: External ear normal.  Nose: Nose normal.  Eyes: Right eye exhibits no discharge. Left eye exhibits no discharge.  Neck: Neck supple.  Cardiovascular: Normal rate, regular rhythm and normal heart sounds.   Pulmonary/Chest: Effort normal and breath sounds normal.  Abdominal: Soft. There is no tenderness.  Musculoskeletal: He exhibits no edema.  Neurological: He is alert and oriented to person, place, and time.  Slight left facial droop. 3/5 strength LUE. 4/5 strength LLE. RUE and RLE normal. Mild slurred speech.  Skin: Skin is warm and dry.  Nursing note and vitals reviewed.    ED Treatments / Results  DIAGNOSTIC STUDIES:  Oxygen Saturation is 100% on RA, normal by my interpretation.    COORDINATION OF CARE:  12:47 AM Discussed treatment plan with pt at bedside and pt agreed to plan.  Labs (all labs ordered are listed, but only abnormal results are displayed) Labs Reviewed  CBC - Abnormal; Notable for the following:       Result  Value   RBC 5.90 (*)    MCV 76.6 (*)    MCH 25.8 (*)    RDW 16.2 (*)    All other components within normal limits  COMPREHENSIVE METABOLIC PANEL - Abnormal; Notable for the following:    Potassium 3.3 (*)    Total Protein 8.2 (*)    ALT 15 (*)    All other components within normal limits  I-STAT CHEM 8, ED - Abnormal; Notable for the following:    Potassium 3.3 (*)    Calcium, Ion 1.11 (*)    All other components within normal limits  PROTIME-INR  APTT  DIFFERENTIAL  RAPID URINE DRUG SCREEN, HOSP PERFORMED  I-STAT TROPOININ, ED  CBG MONITORING, ED    EKG  EKG  Interpretation  Date/Time:  Friday November 05 2016 00:45:25 EST Ventricular Rate:  90 PR Interval:    QRS Duration: 81 QT Interval:  389 QTC Calculation: 476 R Axis:   -51 Text Interpretation:  Normal sinus rhythm Inferior infarct, old bradycardia resolved, otherwise no significant change since 2009 Confirmed by Sham Alviar MD, Ramandeep Arington [phone]) on 11/05/2016 12:59:01 AM       Radiology Ct Head Wo Contrast  Result Date: 11/05/2016 CLINICAL DATA:  Initial evaluation for acute left arm weakness. EXAM: CT HEAD WITHOUT CONTRAST TECHNIQUE: Contiguous axial images were obtained from the base of the skull through the vertex without intravenous contrast. COMPARISON:  None. FINDINGS: Brain: Age-related cerebral volume loss. Mild chronic microvascular disease. Remote lacunar infarct noted within the right basal ganglia. Small 11 mm asymmetric hypodensity within the white matter of the left cerebral hemisphere noted, suspected to be remote/chronic microvascular ischemic disease in nature. No acute intracranial hemorrhage. No evidence for acute large vessel territory infarct. No mass lesion, midline shift, or mass effect. No hydrocephalus. No extra-axial fluid collection. Vascular: Intracranial vasculature somewhat diffusely hyperdense, with no asymmetric hyperdense vessel identified. Skull: Scalp soft tissues within normal limits.  Calvarium intact. Sinuses/Orbits: Globes and orbital soft tissues within normal limits. Mild scattered mucosal thickening within the ethmoidal air cells. Paranasal sinuses are otherwise clear. No mastoid effusion. ASPECTS [facility] Stroke Program Early CT Score) - Ganglionic level infarction (caudate, lentiform nuclei, internal capsule, insula, M1-M3 cortex): 7 - Supraganglionic infarction (M4-M6 cortex): 3 Total score (0-10 with 10 being normal): 10 IMPRESSION: 1. No acute intracranial infarct or other process identified. 2. ASPECTS is 10 3. Remote lacunar infarct involving the right basal  ganglia. 4. Approximate 1 cm asymmetric hypodensity within the white matter of the left cerebellar hemisphere, nonspecific, but suspected to be chronic in nature, possibly due to prior ischemia. Critical Value/emergent results were called by telephone at the time of interpretation on 11/05/2016 at 1:04 am to Dr. Roseanne Reno, who verbally acknowledged these results. Electronically Signed   By: Rise Mu M.D.   On: 11/05/2016 01:07    Procedures Procedures (including critical care time)  CRITICAL CARE Performed by: Pricilla Loveless T   Total critical care time: 35 minutes  Critical care time was exclusive of separately billable procedures and treating other patients.  Critical care was necessary to treat or prevent imminent or life-threatening deterioration.  Critical care was time spent personally by me on the following activities: development of treatment plan with patient and/or surrogate as well as nursing, discussions with consultants, evaluation of patient's response to treatment, examination of patient, obtaining history from patient or surrogate, ordering and performing treatments and interventions, ordering and review of laboratory studies, ordering and review of radiographic studies, pulse  oximetry and re-evaluation of patient's condition.   Medications Ordered in ED Medications  nicardipine (CARDENE) 20mg  in 0.86% saline 200ml IV infusion (0.1 mg/ml) (15 mg/hr Intravenous Transfusing/Transfer 11/05/16 0206)  alteplase (ACTIVASE) 1 mg/mL infusion 75 mg (0 mg/kg  82.9 kg Intravenous Stopped 11/05/16 0206)     Initial Impression / Assessment and Plan / ED Course  I have reviewed the triage vital signs and the nursing notes.  Pertinent labs & imaging results that were available during my care of the patient were reviewed by me and considered in my medical decision making (see chart for details).  Clinical Course as of Nov 05 206  Caleen EssexFri Nov 05, 2016  [id number] Given acute onset and  continued CVA like symptoms, Neuro is giving TPA. Getting cardene for significant HTN to get under goal. No HA.  [SG]    Clinical Course User Index [SG] Pricilla LovelessScott Janet Decesare, MD    Patient has been protecting airway since arrival. CT w/o bleed. After TPA, just prior to leaving for ICU, patient recovered function, facial droop and slurred speech resolved. HTN significantly better after cardene started.  Final Clinical Impressions(s) / ED Diagnoses   Final diagnoses:  Cerebrovascular accident (CVA), unspecified mechanism (HCC)  Hypertensive emergency    New Prescriptions New Prescriptions   No medications on file   I personally performed the services described in this documentation, which was scribed in my presence. The recorded information has been reviewed and is accurate.     Pricilla LovelessScott Maynor Mwangi, MD 11/05/16 [phone]

## 2016-11-05 NOTE — Care Management (Addendum)
Pt's home antihypertensive medication is >$200 and pt is without prescription coverage - CM requested via physician sticky notes to review home med list and determine if pt can be discharge home on a cheaper option.  CM will provide pt with local clinic information for PCP and medication assistance - may need MATCH at discharge

## 2016-11-05 NOTE — ED Notes (Signed)
Patient is A&Ox4 at this time.  Neurology at bedside to assess patient fully.  BP is high and starting on cardene

## 2016-11-05 NOTE — ED Notes (Signed)
Speech returned, swallow screen completed and passed.

## 2016-11-05 NOTE — ED Notes (Signed)
Patient having neuro changes including slurred speech and left arm unable to hold up.  Symptoms going back to how he was before TPA.  Neurologist aware.  Will transport patient to ICU.

## 2016-11-05 NOTE — Progress Notes (Signed)
  Echocardiogram 2D Echocardiogram has been performed.  Tayen Narang L Androw 11/05/2016, 3:23 PM

## 2016-11-06 ENCOUNTER — Inpatient Hospital Stay (HOSPITAL_COMMUNITY): Payer: Self-pay

## 2016-11-06 ENCOUNTER — Encounter (HOSPITAL_COMMUNITY): Payer: Self-pay | Admitting: Radiology

## 2016-11-06 DIAGNOSIS — G451 Carotid artery syndrome (hemispheric): Secondary | ICD-10-CM

## 2016-11-06 DIAGNOSIS — I10 Essential (primary) hypertension: Secondary | ICD-10-CM

## 2016-11-06 DIAGNOSIS — E785 Hyperlipidemia, unspecified: Secondary | ICD-10-CM

## 2016-11-06 DIAGNOSIS — F17200 Nicotine dependence, unspecified, uncomplicated: Secondary | ICD-10-CM

## 2016-11-06 LAB — CBC
HCT: 39 % (ref 39.0–52.0)
HEMOGLOBIN: 12.6 g/dL — AB (ref 13.0–17.0)
MCH: 24.9 pg — ABNORMAL LOW (ref 26.0–34.0)
MCHC: 32.6 g/dL (ref 30.0–36.0)
MCV: 76.5 fL — ABNORMAL LOW (ref 78.0–100.0)
Platelets: 242 10*3/uL (ref 150–400)
RBC: 5.1 MIL/uL (ref 4.22–5.81)
RDW: 16.5 % — ABNORMAL HIGH (ref 11.5–15.5)
WBC: 5.8 10*3/uL (ref 4.0–10.5)

## 2016-11-06 LAB — BASIC METABOLIC PANEL
ANION GAP: 7 (ref 5–15)
BUN: 10 mg/dL (ref 6–20)
CHLORIDE: 108 mmol/L (ref 101–111)
CO2: 22 mmol/L (ref 22–32)
CREATININE: 0.83 mg/dL (ref 0.61–1.24)
Calcium: 8.6 mg/dL — ABNORMAL LOW (ref 8.9–10.3)
GFR calc non Af Amer: >60 mL/min (ref >60)
GLUCOSE: 121 mg/dL — AB (ref 65–99)
Potassium: 3.9 mmol/L (ref 3.5–5.1)
Sodium: 137 mmol/L (ref 135–145)

## 2016-11-06 LAB — VITAMIN B12: VITAMIN B 12: 226 pg/mL (ref 180–914)

## 2016-11-06 LAB — HEMOGLOBIN A1C
Hgb A1c MFr Bld: 5.7 % — ABNORMAL HIGH (ref 4.8–5.6)
Mean Plasma Glucose: 117 mg/dL

## 2016-11-06 LAB — HIV ANTIBODY (ROUTINE TESTING W REFLEX): HIV Screen 4th Generation wRfx: NONREACTIVE

## 2016-11-06 LAB — TSH: TSH: 0.893 u[IU]/mL (ref 0.350–4.500)

## 2016-11-06 LAB — RPR: RPR: NONREACTIVE

## 2016-11-06 MED ORDER — IOPAMIDOL (ISOVUE-370) INJECTION 76%
INTRAVENOUS | Status: AC
  Administered 2016-11-06: 50 mL
  Filled 2016-11-06: qty 50

## 2016-11-06 MED ORDER — IRBESARTAN 150 MG PO TABS
150.0000 mg | ORAL_TABLET | Freq: Every day | ORAL | Status: DC
  Filled 2016-11-06: qty 1

## 2016-11-06 MED ORDER — ATORVASTATIN CALCIUM 40 MG PO TABS: 40.0000 mg | ORAL_TABLET | Freq: Every day | ORAL | 6 refills | Status: AC

## 2016-11-06 MED ORDER — IRBESARTAN 300 MG PO TABS
300.0000 mg | ORAL_TABLET | Freq: Every day | ORAL | 6 refills | Status: DC
Start: 2016-11-07 — End: 2016-11-06

## 2016-11-06 MED ORDER — ASPIRIN 81 MG PO TBEC: 81.0000 mg | DELAYED_RELEASE_TABLET | Freq: Every day | ORAL | Status: DC

## 2016-11-06 MED ORDER — IRBESARTAN 150 MG PO TABS
150.0000 mg | ORAL_TABLET | Freq: Once | ORAL | Status: AC
  Administered 2016-11-06: 150 mg via ORAL
  Filled 2016-11-06 (×2): qty 1

## 2016-11-06 MED ORDER — IRBESARTAN 300 MG PO TABS: 300.0000 mg | ORAL_TABLET | Freq: Every day | ORAL | Status: DC

## 2016-11-06 MED ORDER — IRBESARTAN 300 MG PO TABS: 300.0000 mg | ORAL_TABLET | Freq: Every day | ORAL | 6 refills | Status: AC

## 2016-11-06 MED ORDER — ASPIRIN EC 81 MG PO TBEC
81.0000 mg | DELAYED_RELEASE_TABLET | Freq: Every day | ORAL | Status: DC
  Administered 2016-11-06: 81 mg via ORAL
  Filled 2016-11-06: qty 1

## 2016-11-06 MED ORDER — ATORVASTATIN CALCIUM 40 MG PO TABS: 40.0000 mg | ORAL_TABLET | Freq: Every day | ORAL | 6 refills | Status: DC

## 2016-11-06 MED ORDER — ASPIRIN 81 MG PO TBEC: 81.0000 mg | DELAYED_RELEASE_TABLET | Freq: Every day | ORAL | Status: AC

## 2016-11-06 NOTE — Evaluation (Addendum)
Physical Therapy Evaluation/ Discharge Patient Details Name: Todd Hunter MRN: [id number] DOB: Feb 04, 1966 Today's Date: 11/06/2016   History of Present Illness  50yo admitted with CVA s/p tPA. PMHx: HTN, HLD  Clinical Impression  Pt very pleasant, moving well and no current deficits with strength bil UE or bil LE all 5/5, no sensation deficits, visual tracking intact WNL, pt able to complete all balance activities without difficulty. Pt currently at baseline without further needs with education completed for risk factors as well as signs/ symptoms of CVA (BE FAST). Pt aware and agreeable to no further needs, safe for D/C to return home.     Follow Up Recommendations No PT follow up    Equipment Recommendations  None recommended by PT    Recommendations for Other Services       Precautions / Restrictions Precautions Precautions: None      Mobility  Bed Mobility Overal bed mobility: Independent                Transfers Overall transfer level: Independent                  Ambulation/Gait Ambulation/Gait assistance: Independent Ambulation Distance (Feet): 600 Feet Assistive device: None Gait Pattern/deviations: WFL(Within Functional Limits)   Gait velocity interpretation: at or above normal speed for age/gender    Stairs Stairs: Yes Stairs assistance: Independent Stair Management: Alternating pattern;Forwards Number of Stairs: 20    Wheelchair Mobility    Modified Rankin (Stroke Patients Only) Modified Rankin (Stroke Patients Only) Pre-Morbid Rankin Score: No symptoms Modified Rankin: No symptoms     Balance Overall balance assessment: Independent                                           Pertinent Vitals/Pain Pain Assessment: No/denies pain    Home Living Family/patient expects to be discharged to:: Private residence Living Arrangements: Spouse/significant other Available Help at Discharge: Family;Friend(s);Available  24 hours/day Type of Home: House Home Access: Level entry     Home Layout: Two level;Bed/bath upstairs Home Equipment: None      Prior Function Level of Independence: Independent         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        Extremity/Trunk Assessment   Upper Extremity Assessment Upper Extremity Assessment: Overall WFL for tasks assessed    Lower Extremity Assessment Lower Extremity Assessment: Overall WFL for tasks assessed    Cervical / Trunk Assessment Cervical / Trunk Assessment: Normal  Communication   Communication: No difficulties  Cognition Arousal/Alertness: Awake/alert Behavior During Therapy: WFL for tasks assessed/performed Overall Cognitive Status: Within Functional Limits for tasks assessed                      General Comments General comments (skin integrity, edema, etc.): pt able to complete single limb stance, Romberg eyes closed, gait with head turns, change of speed and direction all without difficulty or LOB    Exercises     Assessment/Plan    PT Assessment Patent does not need any further PT services  PT Problem List            PT Treatment Interventions      PT Goals (Current goals can be found in the Care Plan section)  Acute Rehab PT Goals PT Goal Formulation: All  assessment and education complete, DC therapy    Frequency     Barriers to discharge        Co-evaluation               End of Session Equipment Utilized During Treatment: Gait belt Activity Tolerance: Patient tolerated treatment well Patient left: in chair;with family/visitor present;with call bell/phone within reach Nurse Communication: Mobility status         Time: 1191-47821114-1131 PT Time Calculation (min) (ACUTE ONLY): 17 min   Charges:   PT Evaluation $PT Eval Low Complexity: 1 Procedure     PT G Codes:        Armondo Cech B Jahleah Mariscal 11/06/2016, 11:38 AM  Delaney MeigsMaija Tabor Callen Zuba, PT [phone]

## 2016-11-06 NOTE — Progress Notes (Signed)
Dc instructions reviewed with patient and partner. Both vervalized understanding.

## 2016-11-06 NOTE — Progress Notes (Signed)
PT Cancellation Note  Patient Details Name: Fletcher AnonCordice D Lance MRN: [id number] DOB: 09/17/1966   Cancelled Treatment:    Reason Eval/Treat Not Completed: Medical issues which prohibited therapy (pt currently still on bedrest and await increased activity order)   Asif Muchow B Dimitra [place]stock 11/06/2016, 7:12 AM  Delaney MeigsMaija Tabor Konner Warrior, PT [phone]

## 2016-11-06 NOTE — Discharge Summary (Signed)
Stroke Discharge Summary  Patient ID: Todd Hunter   MRN: [id number]      DOB: [birth year]  Date of Admission: 11/05/2016 Date of Discharge: 11/06/2016  Attending Physician:  No att. providers found, Stroke MD Consulting Physician(s):    neurology Dr Roseanne Reno Patient's PCP:  Dr. Ralene Ok  DISCHARGE DIAGNOSIS: Suspected acute stroke treated with t-PA Active Problems:   TIA   HTN   HLD   smoker    BMI: Body mass index is 23.46 kg/m.  Past Medical History:  Diagnosis Date  . Hypertension   . Smoker    History reviewed. No pertinent surgical history.  Allergies as of 11/06/2016   No Known Allergies     Medication List    STOP taking these medications   EDARBI 80 MG Tabs Generic drug:  Azilsartan Medoxomil     TAKE these medications   aspirin 81 MG EC tablet Take 1 tablet (81 mg total) by mouth daily. Start taking on:  11/07/2016   atorvastatin 40 MG tablet Commonly known as:  LIPITOR Take 1 tablet (40 mg total) by mouth daily at 6 PM.   irbesartan 300 MG tablet Commonly known as:  AVAPRO Take 1 tablet (300 mg total) by mouth daily. Start taking on:  11/07/2016       LABORATORY STUDIES CBC    Component Value Date/Time   WBC 5.8 11/06/2016 0323   RBC 5.10 11/06/2016 0323   HGB 12.6 (L) 11/06/2016 0323   HCT 39.0 11/06/2016 0323   PLT 242 11/06/2016 0323   MCV 76.5 (L) 11/06/2016 0323   MCH 24.9 (L) 11/06/2016 0323   MCHC 32.6 11/06/2016 0323   RDW 16.5 (H) 11/06/2016 0323   LYMPHSABS 3.7 11/05/2016 0032   MONOABS 0.8 11/05/2016 0032   EOSABS 0.3 11/05/2016 0032   BASOSABS 0.0 11/05/2016 0032   CMP    Component Value Date/Time   NA 137 11/06/2016 0323   K 3.9 11/06/2016 0323   CL 108 11/06/2016 0323   CO2 22 11/06/2016 0323   GLUCOSE 121 (H) 11/06/2016 0323   BUN 10 11/06/2016 0323   CREATININE 0.83 11/06/2016 0323   CALCIUM 8.6 (L) 11/06/2016 0323   PROT 8.2 (H) 11/05/2016 0032   ALBUMIN 4.1 11/05/2016 0032   AST 19 11/05/2016 0032    ALT 15 (L) 11/05/2016 0032   ALKPHOS 87 11/05/2016 0032   BILITOT 0.4 11/05/2016 0032   GFRNONAA >60 11/06/2016 0323   GFRAA >60 11/06/2016 0323   COAGS Lab Results  Component Value Date   INR 0.97 11/05/2016   Lipid Panel    Component Value Date/Time   CHOL 164 11/05/2016 0712   TRIG 67 11/05/2016 0712   HDL 23 (L) 11/05/2016 0712   CHOLHDL 7.1 11/05/2016 0712   VLDL 13 11/05/2016 0712   LDLCALC 128 (H) 11/05/2016 0712   HgbA1C  Lab Results  Component Value Date   HGBA1C 5.7 (H) 11/05/2016   Cardiac Panel (last 3 results) No results for input(s): CKTOTAL, CKMB, TROPONINI, RELINDX in the last 72 hours. Urinalysis No results found for: COLORURINE, APPEARANCEUR, LABSPEC, PHURINE, GLUCOSEU, HGBUR, BILIRUBINUR, KETONESUR, PROTEINUR, UROBILINOGEN, NITRITE, LEUKOCYTESUR Urine Drug Screen     Component Value Date/Time   LABOPIA NONE DETECTED 11/05/2016 0140   COCAINSCRNUR NONE DETECTED 11/05/2016 0140   LABBENZ NONE DETECTED 11/05/2016 0140   AMPHETMU NONE DETECTED 11/05/2016 0140   THCU NONE DETECTED 11/05/2016 0140   LABBARB NONE DETECTED 11/05/2016 0140  Alcohol Level No results found for: [facility]   SIGNIFICANT DIAGNOSTIC STUDIES I have personally reviewed the radiological images below and agree with the radiology interpretations.  Ct Head Wo Contrast 11/05/2016 1. No acute intracranial infarct or other process identified.  2. ASPECTS is 10  3. Remote lacunar infarct involving the right basal ganglia.  4. Approximate 1 cm asymmetric hypodensity within the white matter of the left cerebellar hemisphere, nonspecific, but suspected to be chronic in nature, possibly due to prior ischemia.   CTA Head and Neck 11/06/2016 1. Negative CTA of the head and neck. Minimal atheromatous disease for patient a age without high-grade or flow-limiting stenosis. 2. Aberrant right subclavian artery. 3. Emphysema. 4. 17 mm left thyroid nodule, indeterminate. Further evaluation with  dedicated thyroid ultrasound is recommended. This could be performed on a nonemergent/outpatient basis.  MRI Brain W/O Contrast  11/06/2016 1. No acute intracranial infarct or other process identified. 2. Remote right basal ganglia lacunar infarct. 3. Few scattered subcentimeter foci of chronic micro hemorrhages as above, most likely related to chronic underlying hypertension. 4. 1 cm T2/FLAIR lesion centered within the white matter of the left cerebellar hemisphere, indeterminate, but may reflect sequelae of prior ischemia or other insult. Postcontrast imaging is recommended for full characterization, as an underlying mass or glioma could conceivably have this appearance.  Transthoracic Echocardiogram 11/05/2016 Study Conclusions   Left ventricle: The cavity size was normal. There was moderate concentric hypertrophy. Systolic function was normal. The estimated ejection fraction was in the range of 60% to 65%. Wall motion was normal; there were no regional wall motion abnormalities. Features are consistent with a pseudonormal left ventricular filling pattern, with concomitant abnormal relaxation and increased filling pressure (grade 2 diastolic dysfunction).      HISTORY OF PRESENT ILLNESS Todd D Goinsis a 50 y.o.malewith a history of hypertension and less than ideal compliance with treatment, presenting with acute onset of left-sided weakness with facial droop and slurred speech. Symptom onset was at 11:55 PM on the evening of admission. He had no previous history of stroke nor TIA. He had not taken antihypertensive medications since 11/01/2016. Blood pressure in the ED was 210/130. He was started on a Cardene drip. CT scan of his head showed no acute intracranial abnormality. Patient was deemed a candidate for TPA which was administered. There was delay in administration due to efforts to get his blood pressure down to acceptable range for TPA administration with Cardene  IV. NIH stroke score was 7.  LSN:11:55 PM on 11/04/2016 tPA Given:Yes mRankin:  HOSPITAL COURSE Todd Hunter is a 51 y.o. male with history of hypertension and medical noncompliance presenting with uncontrolled hypertension, left-sided weakness, slurred speech, and facial droop. He received IV t-PA Friday 11/05/2016 at 0115. Symptoms resolved after tPA. MRI negative for acute stroke. CTA head and neck negative except thyroid nodule.   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  Resultant  Mild left hemiparesis  MRI - No acute findings. Remote right basal ganglia lacunar infarct.   CTA Head and Neck - negative except for notation of a thyroid nodule.  2D Echo - EF 60 - 65%. Cardiac source of emboli identified.  LDL - 128  HgbA1c - 5.7  VTE prophylaxis - SCDs  Diet Heart Room service appropriate? Yes; Fluid consistency: Thin  No antithrombotic prior to admission, now on aspirin 81 mg daily.  Patient counseled to be compliant with his antithrombotic medications.  Ongoing aggressive stroke risk factor  management  Therapy recommendations: No follow-up therapies recommended  Disposition: Discharged to home.  Hypertension              Avapro dosage increased prior to discharge.              Permissive hypertension (OK if < 220/120) but gradually normalize in 5-7 days              Long-term BP goal normotensive  Hyperlipidemia  Home meds:  No lipid lowering medications prior to admission  LDL 128, goal < 70  Start Lipitor 40 mg daily  Continue statin at discharge  Left cerebellar lesion  1 cm T2/FLAIR lesion at left cerebellar hemisphere on MRI, likely remote ischemia.   CT with contrast no enhancement in this area.   No surrounding mass effect  No concerning for brain tumor at this time  Pt has follow-up visit with Dr. Pearlean BrownieSethi.  Tobacco abuse  Current smoker  Smoking cessation counseling provided  Pt  is willing to quit  Other Stroke Risk Factors  ETOH use, advised to drink no more than 1 drink per day  Hx stroke/TIA - remote lacunar infarct involving the right basal ganglia by CT.  Medical noncompliance - importance of blood pressure control reinforced.  Other Active Problems  Hypokalemia - supplemented - recheck -> 3.9  Possible thyroid nodule noted on CTA. Outpatient follow-up through primary care M.D.  PCP follow up next Monday as per pt.   DISCHARGE EXAM Blood pressure (!) 172/99, pulse 70, temperature 98.4 F (36.9 C), temperature source Oral, resp. rate 18, height 6\' 2"  (1.88 m), weight 182 lb 11.2 oz (82.9 kg), SpO2 100 %. Pleasant middle aged african [place] male not in distress. . Afebrile. Head is nontraumatic. Neck is supple without bruit.    Cardiac exam no murmur or gallop. Lungs are clear to auscultation. Distal pulses are well felt.  Neurological Exam :  Awake alert oriented x 3 normal speech and language. Mild left lower face asymmetry. Tongue midline. No drift. Mild diminished fine finger movements on left. Orbits right over left upper extremity. Mild left grip weak.. Normal sensation . Normal coordination.  Discharge Diet   Diet Heart Room service appropriate? Yes; Fluid consistency: Thin liquids  Patient discharge instructions: 1. It is extremely important that you take your medications as directed. 2. Gradually increase your activity as tolerated. 3. Follow-up with your primary care physician within 2 weeks. 4. You have a nodule on your thyroid that will need further evaluation through your primary care physician. 5. Follow-up with neurology as instructed.  DISCHARGE PLAN  Disposition:  Discharged to home  aspirin 81 mg daily and lipitor 40 for secondary stroke prevention.  Ongoing risk factor control by Primary Care Physician at time of discharge  Follow-up with your PCP - Dr. Ralene Okoy Moreira next Monday as scheduled.  Follow-up with Dr. Delia HeadyPramod  Sethi, [facility] in 6 weeks, office to schedule an appointment.  35 minutes were spent preparing discharge.  Marvel PlanJindong Antonette Hendricks, MD PhD Stroke Neurology 11/06/2016 4:07 PM

## 2016-11-06 NOTE — Progress Notes (Signed)
Saline lock iv dcd. Site unremarkable, cath intact. .pressure dressing applied, no bleeding noted.

## 2016-11-06 NOTE — Discharge Instructions (Signed)
Hypertension Hypertension is another name for high blood pressure. High blood pressure forces your heart to work harder to pump blood. A blood pressure reading has two numbers, which includes a higher number over a lower number (example: 110/72). Follow these instructions at home:  Have your blood pressure rechecked by your doctor.  Only take medicine as told by your doctor. Follow the directions carefully. The medicine does not work as well if you skip doses. Skipping doses also puts you at risk for problems.  Do not smoke.  Monitor your blood pressure at home as told by your doctor. Contact a doctor if:  You think you are having a reaction to the medicine you are taking.  You have repeat headaches or feel dizzy.  You have puffiness (swelling) in your ankles.  You have trouble with your vision. Get help right away if:  You get a very bad headache and are confused.  You feel weak, numb, or faint.  You get chest or belly (abdominal) pain.  You throw up (vomit).  You cannot breathe very well. This information is not intended to replace advice given to you by your health care provider. Make sure you discuss any questions you have with your health care provider. Document Released: 03/15/2008 Document Revised: 03/04/2016 Document Reviewed: 07/20/2013 Elsevier Interactive Patient Education  2017 [employer]   Managing Your Hypertension Hypertension is commonly called high blood pressure. Blood pressure is a measurement of how strongly your blood is pressing against the walls of your arteries. Arteries are blood vessels that carry blood from your heart throughout your body. Blood pressure does not stay the same. It rises when you are active, excited, or nervous. It lowers when you are sleeping or relaxed. If the numbers that measure your blood pressure stay above normal most of the time, you are at risk for health problems. Hypertension is a long-term (chronic) condition in which  blood pressure is elevated. This condition often has no signs or symptoms. The cause of the condition is usually not known. What are blood pressure readings? A blood pressure reading is recorded as two numbers, such as "120 over 80" (or 120/80). The first ("top") number is called the systolic pressure. It is a measure of the pressure in your arteries as the heart beats. The second ("bottom") number is called the diastolic pressure. It is a measure of the pressure in your arteries as the heart relaxes between beats. What does my blood pressure reading mean? Blood pressure is classified into four stages. Based on your blood pressure reading, your health care provider may use the following stages to determine what type of treatment, if any, is needed. Systolic pressure and diastolic pressure are measured in a unit called mm Hg. Normal  Systolic pressure: below 120.  Diastolic pressure: below 80. Prehypertension  Systolic pressure: 120-139.  Diastolic pressure: 80-89. Hypertension stage 1  Systolic pressure: 140-159.  Diastolic pressure: 90-99. Hypertension stage 2  Systolic pressure: 160 or above.  Diastolic pressure: 100 or above. What health risks are associated with hypertension? Managing your hypertension is an important responsibility. Uncontrolled hypertension can lead to:  A heart attack.  A stroke.  A weakened blood vessel (aneurysm).  Heart failure.  Kidney damage.  Eye damage.  Metabolic syndrome.  Memory and concentration problems. What changes can I make to manage my hypertension? Hypertension can be managed effectively by making lifestyle changes and possibly by taking medicines. Your health care provider will help you come up with a plan  to bring your blood pressure within a normal range. Your plan should include the following: Monitoring  Monitor your blood pressure at home as told by your health care provider. Your personal target blood pressure may vary  depending on your medical conditions, your age, and other factors.  Have your blood pressure rechecked as told by your health care provider. Lifestyle  Lose weight if necessary.  Get at least 30-45 minutes of aerobic exercise at least 4 times a week.  Do not use any products that contain nicotine or tobacco, such as cigarettes and e-cigarettes. If you need help quitting, ask your health care provider.  Learn ways to reduce stress.  Control any chronic conditions, such as high cholesterol or diabetes. Eating and drinking  Follow the DASH diet. This diet is high in fruits, vegetables, and whole grains. It is low in salt, red meat, and added sugars.  Keep your sodium intake below 2,300 mg per day.  Limit alcoholic beverages. Communication  Review all the medicines you take with your health care provider because there may be side effects or interactions.  Talk with your health care provider about your diet, exercise habits, and other lifestyle factors that may be contributing to hypertension.  See your health care provider regularly. Your health care provider can help you create and adjust your plan for managing hypertension. Will I need medicine to control my blood pressure? Your health care provider may prescribe medicine if lifestyle changes are not enough to get your blood pressure under control, and if one of the following is true:  You are 55-42 years of age, and your systolic blood pressure is 140 or higher.  You are 15 years of age or older, and your systolic blood pressure is 150 or higher.  Your diastolic blood pressure is 90 or higher.  You have diabetes, and your systolic blood pressure is over 140 or your diastolic blood pressure is over 90.  You have kidney disease, and your blood pressure is above 140/90.  You have heart disease or a history of stroke, and your blood pressure is 140/90 or higher. Take medicines only as told by your health care provider. Follow the  directions carefully. Blood pressure medicines must be taken as prescribed. The medicine does not work as well when you skip doses. Skipping doses also puts you at risk for problems. Contact a health care provider if:  You think you are having a reaction to medicines you have taken.  You have repeated (recurrent) headaches.  You feel dizzy.  You have swelling in your ankles.  You have trouble with your vision. Get help right away if:  You develop a severe headache or confusion.  You have unusual weakness or numbness, or you feel faint.  You have severe pain in your chest or abdomen.  You vomit repeatedly.  You have trouble breathing. This information is not intended to replace advice given to you by your health care provider. Make sure you discuss any questions you have with your health care provider. Document Released: 06/21/2012 Document Revised: 06/01/2016 Document Reviewed: 12/26/2015 Elsevier Interactive Patient Education  2017 [employer].  1. It is extremely important that you take your medications as directed. 2. Gradually increase your activity as tolerated. 3. Follow-up with your primary care physician within 2 weeks. 4. You have a nodule on your thyroid that will need further evaluation through your primary care physician. 5. Follow-up with neurology as instructed.

## 2016-11-10 NOTE — Care Management (Signed)
Pt discharged home over the weekend   CM left voicemail for pt requesting call back to ensure he was able to obtain prescribed discharge medications.  CM contacted pharmacy [facility] [address] [place] and confirmed that pt did pick up both lipitor and avapro 11/06/16.

## 2017-02-01 ENCOUNTER — Other Ambulatory Visit: Payer: Self-pay

## 2017-02-01 NOTE — Patient Outreach (Signed)
Contact number provided at discharge were incorrect/not working. Unable to obtain mRS 

## 2020-01-19 ENCOUNTER — Ambulatory Visit: Payer: Self-pay
